# Patient Record
Sex: Male | Born: 1995 | Race: White | Hispanic: No | Marital: Single | Smoking: Never smoker
Health system: Southern US, Community
[De-identification: ages and names within clinical notes are randomized; demographics above are authoritative.]

## PROBLEM LIST (undated history)

## (undated) DIAGNOSIS — F988 Other specified behavioral and emotional disorders with onset usually occurring in childhood and adolescence: Secondary | ICD-10-CM

## (undated) DIAGNOSIS — T7840XA Allergy, unspecified, initial encounter: Secondary | ICD-10-CM

## (undated) HISTORY — DX: Allergy, unspecified, initial encounter: T78.40XA

## (undated) HISTORY — PX: NO PAST SURGERIES: SHX2092

---
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
## 2017-03-07 ENCOUNTER — Encounter: Payer: Self-pay | Admitting: Family Medicine

## 2017-03-07 ENCOUNTER — Ambulatory Visit (INDEPENDENT_AMBULATORY_CARE_PROVIDER_SITE_OTHER): Payer: 59 | Admitting: Family Medicine

## 2017-03-07 VITALS — BP 102/68 | HR 87 | Temp 98.4°F | Wt 220.6 lb

## 2017-03-07 DIAGNOSIS — Z23 Encounter for immunization: Secondary | ICD-10-CM | POA: Diagnosis not present

## 2017-03-07 DIAGNOSIS — F988 Other specified behavioral and emotional disorders with onset usually occurring in childhood and adolescence: Secondary | ICD-10-CM

## 2017-03-07 MED ORDER — ATOMOXETINE HCL 80 MG PO CAPS: ORAL_CAPSULE | ORAL | 6 refills | Status: DC

## 2017-03-07 NOTE — Addendum Note (Signed)
Addended by: Dortha KernHRISMON, Eyonna Sandstrom E on: 03/07/2017 02:19 PM   Modules accepted: Orders

## 2017-03-07 NOTE — Progress Notes (Signed)
   Patient: Jonathan Mcclure Male    DOB: 03/24/1996   21 y.o.   MRN: [id number] Visit Date: 03/07/2017  Today's Provider: Dortha Kernennis Chrismon, PA   Chief Complaint  Patient presents with  . ADD  . Follow-up   Subjective:    HPI ADD Follow Up:  Patient is here for 3 month follow up. Last OV was 12/06/2016. Patient advised to continue Strattera 80 mg. Patient reports good compliance with treatment plan. Symptoms are stable. He is requesting a refill on Strattera.   Past Medical History:  Diagnosis Date  . ADD (attention deficit disorder)   . ADD (attention deficit disorder)   . Allergy    Past Surgical History:  Procedure Laterality Date  . NO PAST SURGERIES     Family History  Problem Relation Age of Onset  . Autism Sister   . Diabetes Maternal Grandfather   . Heart disease Paternal Grandfather    No Known Allergies   Previous Medications   ATOMOXETINE (STRATTERA) 80 MG CAPSULE    Take one capsule by mouth daily.    Review of Systems  Constitutional: Negative.   Respiratory: Negative.   Cardiovascular: Negative.   Psychiatric/Behavioral: Negative.     Social History  Substance Use Topics  . Smoking status: Never Smoker  . Smokeless tobacco: Never Used  . Alcohol use No   Objective:   BP 102/68 (BP Location: Right Arm, Patient Position: Sitting, Cuff Size: Large)   Pulse 87   Temp 98.4 F (36.9 C) (Oral)   Wt 220 lb 9.6 oz (100.1 kg)   SpO2 98%   BMI 33.05 kg/m  Wt Readings from Last 3 Encounters:  03/07/17 220 lb 9.6 oz (100.1 kg) (97 %, Z= 1.91)*  12/06/16 230 lb 3.2 oz (104.4 kg) (98 %, Z= 2.10)*  10/25/16 230 lb 9.6 oz (104.6 kg) (98 %, Z= 2.11)*   * Growth percentiles are based on CDC 2-20 Years data.    Physical Exam  Constitutional: He is oriented to person, place, and time. He appears well-developed and well-nourished. No distress.  HENT:  Head: Normocephalic and atraumatic.  Right Ear: Hearing normal.  Left Ear: Hearing normal.  Nose: Nose  normal.  Eyes: Conjunctivae and lids are normal. Right eye exhibits no discharge. Left eye exhibits no discharge. No scleral icterus.  Cardiovascular: Normal rate.   Pulmonary/Chest: Effort normal and breath sounds normal. No respiratory distress.  Abdominal: Bowel sounds are normal.  Musculoskeletal: Normal range of motion.  Neurological: He is alert and oriented to person, place, and time.  Skin: Skin is intact. No lesion and no rash noted.  Psychiatric: He has a normal mood and affect. His speech is normal and behavior is normal. Thought content normal.      Assessment & Plan:     1. Attention deficit disorder (ADD) without hyperactivity Stable and well controlled. Good focus and sleeping well at night. No daytime drowsiness and good appetite. Refill Strattera and recheck in 4 months. - atomoxetine (STRATTERA) 80 MG capsule; Take one capsule by mouth daily.  Dispense: 30 capsule; Refill: 6  2. Need for influenza vaccination - Flu Vaccine QUAD 6+ mos PF IM (Fluarix Quad PF)

## 2017-03-08 ENCOUNTER — Ambulatory Visit: Payer: Self-pay

## 2017-07-07 ENCOUNTER — Encounter: Payer: Self-pay | Admitting: Family Medicine

## 2017-07-07 ENCOUNTER — Ambulatory Visit (INDEPENDENT_AMBULATORY_CARE_PROVIDER_SITE_OTHER): Payer: 59 | Admitting: Family Medicine

## 2017-07-07 VITALS — BP 128/68 | HR 98 | Temp 98.1°F | Wt 225.8 lb

## 2017-07-07 DIAGNOSIS — F988 Other specified behavioral and emotional disorders with onset usually occurring in childhood and adolescence: Secondary | ICD-10-CM

## 2017-07-07 NOTE — Progress Notes (Signed)
     Patient: Jonathan ReaperJoseph D Odonell Male    DOB: 09/28/1995   22 y.o.   MRN: [id number] Visit Date: 07/07/2017  Today's Provider: Dortha Kernennis Rhylee Nunn, PA   Chief Complaint  Patient presents with  . ADD follow up   Subjective:    HPI ADD Follow Up:  Patient is here for 4 month follow up. Last OV was 03/07/2017. Patient advised to continue Strattera 80 mg. Patient reports poor compliance with treatment plan. Patient states he discontinued medication because he is joining the [employer]. He is set to join [employer] on 07/11/16. Symptoms are stable with medication at this time.   Past Medical History:  Diagnosis Date  . ADD (attention deficit disorder)   . ADD (attention deficit disorder)   . Allergy    Past Surgical History:  Procedure Laterality Date  . NO PAST SURGERIES     Family History  Problem Relation Age of Onset  . Autism Sister   . Diabetes Maternal Grandfather   . Heart disease Paternal Grandfather    No Known Allergies  Current Outpatient Medications:  .  atomoxetine (STRATTERA) 80 MG capsule, Take one capsule by mouth daily., Disp: 30 capsule, Rfl: 6  Review of Systems  Constitutional: Negative.   Respiratory: Negative.   Cardiovascular: Negative.   Psychiatric/Behavioral: Negative.    Social History   Tobacco Use  . Smoking status: Never Smoker  . Smokeless tobacco: Never Used  Substance Use Topics  . Alcohol use: No   Objective:   BP 128/68 (BP Location: Right Arm, Patient Position: Sitting, Cuff Size: Normal)   Pulse 98   Temp 98.1 F (36.7 C) (Oral)   Wt 225 lb 12.8 oz (102.4 kg)   SpO2 97%   BMI 33.83 kg/m  Wt Readings from Last 3 Encounters:  07/07/17 225 lb 12.8 oz (102.4 kg) (98 %, Z= 1.99)*  03/07/17 220 lb 9.6 oz (100.1 kg) (97 %, Z= 1.91)*  12/06/16 230 lb 3.2 oz (104.4 kg) (98 %, Z= 2.10)*   * Growth percentiles are based on CDC (Boys, 2-20 Years) data.   Physical Exam  Constitutional: He is oriented to person, place, and time. He appears  well-developed and well-nourished. No distress.  HENT:  Head: Normocephalic and atraumatic.  Right Ear: Hearing and external ear normal.  Left Ear: Hearing and external ear normal.  Nose: Nose normal.  Mouth/Throat: Oropharynx is clear and moist.  Eyes: Conjunctivae and lids are normal. Right eye exhibits no discharge. Left eye exhibits no discharge. No scleral icterus.  Neck: Neck supple.  Cardiovascular: Normal rate and regular rhythm.  Pulmonary/Chest: Effort normal and breath sounds normal. No respiratory distress.  Abdominal: Soft. Bowel sounds are normal.  Musculoskeletal: Normal range of motion.  Lymphadenopathy:    He has no cervical adenopathy.  Neurological: He is alert and oriented to person, place, and time.  Skin: Skin is intact. No lesion and no rash noted.  Psychiatric: He has a normal mood and affect. His speech is normal and behavior is normal. Thought content normal.      Assessment & Plan:     1. Attention deficit disorder (ADD) without hyperactivity Has been off the Strattera for a couple weeks without signs of focur or concentration difficulties. Sleeping well without irritability or aggression. Discontinue all Strattera and follow up as needed.       Dortha Kernennis Da Michelle, PA  [facility]

## 2018-06-17 DIAGNOSIS — M5408 Panniculitis affecting regions of neck and back, sacral and sacrococcygeal region: Secondary | ICD-10-CM | POA: Diagnosis not present

## 2018-06-17 DIAGNOSIS — M9903 Segmental and somatic dysfunction of lumbar region: Secondary | ICD-10-CM | POA: Diagnosis not present

## 2018-06-17 DIAGNOSIS — M545 Low back pain: Secondary | ICD-10-CM | POA: Diagnosis not present

## 2018-06-17 DIAGNOSIS — M546 Pain in thoracic spine: Secondary | ICD-10-CM | POA: Diagnosis not present

## 2018-06-17 DIAGNOSIS — M9902 Segmental and somatic dysfunction of thoracic region: Secondary | ICD-10-CM | POA: Diagnosis not present

## 2018-06-18 DIAGNOSIS — M9903 Segmental and somatic dysfunction of lumbar region: Secondary | ICD-10-CM | POA: Diagnosis not present

## 2018-06-18 DIAGNOSIS — M546 Pain in thoracic spine: Secondary | ICD-10-CM | POA: Diagnosis not present

## 2018-06-18 DIAGNOSIS — M545 Low back pain: Secondary | ICD-10-CM | POA: Diagnosis not present

## 2018-06-18 DIAGNOSIS — M9902 Segmental and somatic dysfunction of thoracic region: Secondary | ICD-10-CM | POA: Diagnosis not present

## 2018-06-18 DIAGNOSIS — M5408 Panniculitis affecting regions of neck and back, sacral and sacrococcygeal region: Secondary | ICD-10-CM | POA: Diagnosis not present

## 2018-06-22 DIAGNOSIS — M25571 Pain in right ankle and joints of right foot: Secondary | ICD-10-CM | POA: Diagnosis not present

## 2018-07-07 DIAGNOSIS — M545 Low back pain: Secondary | ICD-10-CM | POA: Diagnosis not present

## 2018-07-07 DIAGNOSIS — M9903 Segmental and somatic dysfunction of lumbar region: Secondary | ICD-10-CM | POA: Diagnosis not present

## 2018-07-07 DIAGNOSIS — M5408 Panniculitis affecting regions of neck and back, sacral and sacrococcygeal region: Secondary | ICD-10-CM | POA: Diagnosis not present

## 2018-07-07 DIAGNOSIS — M9902 Segmental and somatic dysfunction of thoracic region: Secondary | ICD-10-CM | POA: Diagnosis not present

## 2018-07-07 DIAGNOSIS — M546 Pain in thoracic spine: Secondary | ICD-10-CM | POA: Diagnosis not present

## 2018-07-09 NOTE — Progress Notes (Signed)
Patient: Jonathan ReaperJoseph D Deblanc Male    DOB: 01/22/1996   23 y.o.   MRN: [id number] Visit Date: 07/10/2018  Today's Provider: Dortha Kernennis Lucill Mauck, PA   Chief Complaint  Patient presents with  . Follow-up    imaging   Subjective:     HPI  Patient comes in today for follow-up of a right anterolateral talus osteochondral fracture with questionable small loose body seen on MRI on 02-19-18 secondary to stepping into a deep pothole while marching with a heavy back pack weight in June 2019 during [employer] training. Was advised to go to an Urgent Care Clinic in July 2019 because pain persisted and that was when bony changes were first recognized. He was treated with a cam cast, crutches and went through physical therapy with the [employer]. Continued to have pain and difficulties with the right ankle. He was advised to get a bone density test in September 2019 and was told it raised a question of bone thinning. He has had evaluation by a local orthopedist (Dr. Dion SaucierLandau) on 06-22-18 who felt he was progressing slowly with his healing. He is walking better but has not been running. Has continued some exercise without pain or swelling. Worried about possibility of osteoporosis causing him to be discharged from [employer] and wants to get a second opinion BMD test.  Past Medical History:  Diagnosis Date  . ADD (attention deficit disorder)   . ADD (attention deficit disorder)   . Allergy    Past Surgical History:  Procedure Laterality Date  . NO PAST SURGERIES     Family History  Problem Relation Age of Onset  . Autism Sister   . Diabetes Maternal Grandfather   . Heart disease Paternal Grandfather    No Known Allergies  Current Outpatient Medications:  .  Cholecalciferol (VITAMIN D) 50 MCG (2000 UT) CAPS, Take by mouth., Disp: , Rfl:  .  atomoxetine (STRATTERA) 80 MG capsule, Take one capsule by mouth daily. (Patient not taking: Reported on 07/10/2018), Disp: 30 capsule, Rfl: 6  Review of  Systems  Constitutional: Negative.   HENT: Negative.   Eyes: Negative.   Respiratory: Negative.   Cardiovascular: Negative.   Gastrointestinal: Negative.   Genitourinary: Negative.   Musculoskeletal: Negative.     Social History   Tobacco Use  . Smoking status: Never Smoker  . Smokeless tobacco: Never Used  Substance Use Topics  . Alcohol use: No     Objective:   BP 112/62 (BP Location: Right Arm, Patient Position: Sitting, Cuff Size: Large)   Pulse 73   Temp 98 F (36.7 C) (Oral)   Resp 16   Wt 209 lb 12.8 oz (95.2 kg)   SpO2 98%   BMI 31.44 kg/m  Vitals:   07/10/18 0930  BP: 112/62  Pulse: 73  Resp: 16  Temp: 98 F (36.7 C)  TempSrc: Oral  SpO2: 98%  Weight: 209 lb 12.8 oz (95.2 kg)   Physical Exam Constitutional:      General: He is not in acute distress.    Appearance: He is well-developed.  HENT:     Head: Normocephalic and atraumatic.     Right Ear: Hearing and tympanic membrane normal.     Left Ear: Hearing and tympanic membrane normal.     Nose: Nose normal.  Eyes:     General: Lids are normal. No scleral icterus.       Right eye: No discharge.  Left eye: No discharge.     Conjunctiva/sclera: Conjunctivae normal.  Neck:     Musculoskeletal: Neck supple.  Cardiovascular:     Rate and Rhythm: Normal rate and regular rhythm.  Pulmonary:     Effort: Pulmonary effort is normal. No respiratory distress.  Abdominal:     General: Bowel sounds are normal.     Palpations: Abdomen is soft.  Musculoskeletal: Normal range of motion.  Skin:    Findings: No lesion or rash.  Neurological:     Mental Status: He is alert and oriented to person, place, and time.     Motor: No weakness.     Gait: Gait normal.     Deep Tendon Reflexes: Reflexes normal.  Psychiatric:        Speech: Speech normal.        Behavior: Behavior normal.        Thought Content: Thought content normal.       Assessment & Plan    1. Closed displaced fracture of right  talus with delayed healing, unspecified portion of talus, subsequent encounter Onset of initial injury was in June 2019 when he fell as he stepped into a pothole that was 6-8" deep while marching with a heavy back pack. MRI on 02-19-18 demonstrated an osteochondral fracture of the right anterolateral talus with a questionable small loose body. No limping or ataxic gait today. No tenderness to palpation or testing ROM. Worried about osteoporosis with history of multiple fracture events as a child and this episode. Will get BMD test for confirmation. Continue diet rich in calcium and Vitamin-D 2000 IU daily. Proceed with orthopedic follow up with Dr. Dion SaucierLandau (at [facility]) as planned - DG Bone Density  2. Screening for osteoporosis Will get BMD with history of multiple fractures (once in both wrists on separate occasions and now the right talus).  - DG Bone Density      Dortha Kernennis Audrionna Lampton, PA  [facility]

## 2018-07-10 ENCOUNTER — Encounter: Payer: Self-pay | Admitting: Family Medicine

## 2018-07-10 ENCOUNTER — Ambulatory Visit (INDEPENDENT_AMBULATORY_CARE_PROVIDER_SITE_OTHER): Payer: 59 | Admitting: Family Medicine

## 2018-07-10 VITALS — BP 112/62 | HR 73 | Temp 98.0°F | Resp 16 | Wt 209.8 lb

## 2018-07-10 DIAGNOSIS — Z13820 Encounter for screening for osteoporosis: Secondary | ICD-10-CM | POA: Diagnosis not present

## 2018-07-10 DIAGNOSIS — S92101G Unspecified fracture of right talus, subsequent encounter for fracture with delayed healing: Secondary | ICD-10-CM

## 2018-07-20 DIAGNOSIS — M25571 Pain in right ankle and joints of right foot: Secondary | ICD-10-CM | POA: Diagnosis not present

## 2018-07-21 DIAGNOSIS — M5408 Panniculitis affecting regions of neck and back, sacral and sacrococcygeal region: Secondary | ICD-10-CM | POA: Diagnosis not present

## 2018-07-21 DIAGNOSIS — M9902 Segmental and somatic dysfunction of thoracic region: Secondary | ICD-10-CM | POA: Diagnosis not present

## 2018-07-21 DIAGNOSIS — M545 Low back pain: Secondary | ICD-10-CM | POA: Diagnosis not present

## 2018-07-21 DIAGNOSIS — M9903 Segmental and somatic dysfunction of lumbar region: Secondary | ICD-10-CM | POA: Diagnosis not present

## 2018-07-21 DIAGNOSIS — M546 Pain in thoracic spine: Secondary | ICD-10-CM | POA: Diagnosis not present

## 2018-08-27 ENCOUNTER — Ambulatory Visit
Admission: RE | Admit: 2018-08-27 | Discharge: 2018-08-27 | Disposition: A | Discharge status: Home / Self Care | Payer: 59 | Source: Ambulatory Visit | Attending: Family Medicine | Admitting: Family Medicine

## 2018-08-27 DIAGNOSIS — S92101G Unspecified fracture of right talus, subsequent encounter for fracture with delayed healing: Secondary | ICD-10-CM | POA: Diagnosis not present

## 2018-08-27 DIAGNOSIS — Z13820 Encounter for screening for osteoporosis: Secondary | ICD-10-CM | POA: Diagnosis not present

## 2018-11-30 ENCOUNTER — Telehealth: Payer: Self-pay | Admitting: *Deleted

## 2018-11-30 NOTE — Telephone Encounter (Signed)
Please advise 

## 2018-11-30 NOTE — Telephone Encounter (Signed)
Patient states he was exposed to covid-19 at work by several positive people. Patient has been advised by his employer to contact his provider for testing. Please advise?

## 2018-12-01 ENCOUNTER — Telehealth: Payer: Self-pay

## 2018-12-01 DIAGNOSIS — Z20822 Contact with and (suspected) exposure to covid-19: Secondary | ICD-10-CM

## 2018-12-01 NOTE — Telephone Encounter (Signed)
Patient called and left message to return call to schedule testing.

## 2018-12-01 NOTE — Telephone Encounter (Signed)
Forward message.

## 2018-12-01 NOTE — Telephone Encounter (Signed)
Jonathan Mcclure request COVID 19 test. Left message for pt. to call back and schedule testing.

## 2018-12-01 NOTE — Telephone Encounter (Signed)
See telephone encounter for 12/01/18. Message left for pt to return call for scheduling.

## 2018-12-03 NOTE — Addendum Note (Signed)
Addended by: Phillips Odor on: 12/03/2018 08:45 AM   Modules accepted: Orders

## 2018-12-03 NOTE — Telephone Encounter (Signed)
Pt. Returned call.  Scheduled for COVID 19 test 12/04/18 @ 8:30 AM, at the [facility].  Instructed to wear mask, drive up to site, and remain in car.  Verb. Understanding.

## 2018-12-04 ENCOUNTER — Other Ambulatory Visit: Payer: 59

## 2018-12-04 DIAGNOSIS — Z20822 Contact with and (suspected) exposure to covid-19: Secondary | ICD-10-CM

## 2018-12-04 DIAGNOSIS — R6889 Other general symptoms and signs: Secondary | ICD-10-CM | POA: Diagnosis not present

## 2018-12-07 LAB — NOVEL CORONAVIRUS, NAA: SARS-CoV-2, NAA: NOT DETECTED

## 2019-03-17 ENCOUNTER — Ambulatory Visit (INDEPENDENT_AMBULATORY_CARE_PROVIDER_SITE_OTHER): Payer: 59

## 2019-03-17 ENCOUNTER — Other Ambulatory Visit: Payer: Self-pay

## 2019-03-17 DIAGNOSIS — Z23 Encounter for immunization: Secondary | ICD-10-CM | POA: Diagnosis not present

## 2019-06-03 ENCOUNTER — Telehealth: Payer: Self-pay

## 2019-06-03 NOTE — Telephone Encounter (Signed)
Spoke with [facility] and they do not see patient test from 05/21/2019. Patient was advised to go and retest. Patient verbalized understanding

## 2019-06-09 ENCOUNTER — Other Ambulatory Visit: Payer: Self-pay | Admitting: *Deleted

## 2019-06-09 DIAGNOSIS — Z20822 Contact with and (suspected) exposure to covid-19: Secondary | ICD-10-CM

## 2019-06-10 LAB — NOVEL CORONAVIRUS, NAA: SARS-CoV-2, NAA: NOT DETECTED

## 2019-08-27 ENCOUNTER — Other Ambulatory Visit: Payer: Self-pay

## 2019-08-27 ENCOUNTER — Ambulatory Visit (INDEPENDENT_AMBULATORY_CARE_PROVIDER_SITE_OTHER): Payer: 59 | Admitting: Family Medicine

## 2019-08-27 ENCOUNTER — Encounter: Payer: Self-pay | Admitting: Family Medicine

## 2019-08-27 VITALS — BP 113/71 | HR 84 | Temp 97.1°F | Resp 16 | Ht 69.0 in | Wt 230.0 lb

## 2019-08-27 DIAGNOSIS — F988 Other specified behavioral and emotional disorders with onset usually occurring in childhood and adolescence: Secondary | ICD-10-CM | POA: Diagnosis not present

## 2019-08-27 DIAGNOSIS — L659 Nonscarring hair loss, unspecified: Secondary | ICD-10-CM | POA: Diagnosis not present

## 2019-08-27 DIAGNOSIS — M25561 Pain in right knee: Secondary | ICD-10-CM | POA: Diagnosis not present

## 2019-08-27 MED ORDER — ATOMOXETINE HCL 80 MG PO CAPS: ORAL_CAPSULE | ORAL | 6 refills | Status: DC

## 2019-08-27 NOTE — Progress Notes (Signed)
Patient: Jonathan Mcclure Male    DOB: 11-26-1995   24 y.o.   MRN: [id number] Visit Date: 08/27/2019  Today's Provider: Dortha Kern, PA   Chief Complaint  Patient presents with  . Follow-up  . ADHD  . Knee Pain   Subjective:     HPI   Attention deficit disorder (ADD) without hyperactivity From 07/07/2017-Has been off the Strattera for a couple weeks without signs of focur or concentration difficulties. Sleeping well without irritability or aggression. Discontinue all Strattera and follow up as needed.  Patient is not currently taking medication for ADHD. However he is still having attention issues.  Patient also wants to discuss right knee and ankle pain, from a fracture he sustained in June 2019. Patient states he is still hain pain in the right knee and ankle. Patient also states he has slight intermittent swelling in right ankle. Patient states he has been treating pain with tylenol and ibuprofen with moderate relief.  Past Medical History:  Diagnosis Date  . ADD (attention deficit disorder)   . ADD (attention deficit disorder)   . Allergy    Past Surgical History:  Procedure Laterality Date  . NO PAST SURGERIES     Family History  Problem Relation Age of Onset  . Autism Sister   . Diabetes Maternal Grandfather   . Heart disease Paternal Grandfather    No Known Allergies  Current Outpatient Medications:  .  BIOTIN PO, Take by mouth., Disp: , Rfl:  .  Cholecalciferol (VITAMIN D) 50 MCG (2000 UT) CAPS, Take by mouth., Disp: , Rfl:  .  atomoxetine (STRATTERA) 80 MG capsule, Take one capsule by mouth daily. (Patient not taking: Reported on 07/10/2018), Disp: 30 capsule, Rfl: 6  Review of Systems  Constitutional: Negative for appetite change, chills and fever.  Respiratory: Negative for chest tightness, shortness of breath and wheezing.   Cardiovascular: Negative for chest pain and palpitations.  Gastrointestinal: Negative for abdominal pain, nausea and  vomiting.    Social History   Tobacco Use  . Smoking status: Never Smoker  . Smokeless tobacco: Never Used  Substance Use Topics  . Alcohol use: No     Objective:   BP 113/71 (BP Location: Right Arm, Patient Position: Sitting, Cuff Size: Large)   Pulse 84   Temp (!) 97.1 F (36.2 C) (Other (Comment))   Resp 16   Ht 5\' 9"  (1.753 m)   Wt 230 lb (104.3 kg)   SpO2 97%   BMI 33.97 kg/m  Vitals:   08/27/19 1334  BP: 113/71  Pulse: 84  Resp: 16  Temp: (!) 97.1 F (36.2 C)  TempSrc: Other (Comment)  SpO2: 97%  Weight: 230 lb (104.3 kg)  Height: 5\' 9"  (1.753 m)  Body mass index is 33.97 kg/m.  Physical Exam Constitutional:      General: He is not in acute distress.    Appearance: He is well-developed.  HENT:     Head: Normocephalic and atraumatic.     Right Ear: Hearing and tympanic membrane normal.     Left Ear: Hearing and tympanic membrane normal.     Nose: Nose normal.  Eyes:     General: Lids are normal. No scleral icterus.       Right eye: No discharge.        Left eye: No discharge.     Conjunctiva/sclera: Conjunctivae normal.  Cardiovascular:     Rate and Rhythm: Normal rate and regular rhythm.  Heart sounds: Normal heart sounds.  Pulmonary:     Effort: Pulmonary effort is normal. No respiratory distress.  Musculoskeletal:        General: Normal range of motion.     Cervical back: Neck supple.     Comments: Fair ROM in all extremities. No swelling, locking or clicking in knees. History of fracture in the right ankle/talus in 2019.  Skin:    Findings: No lesion or rash.     Comments: No alopecia. Receding hairline.  Neurological:     Mental Status: He is alert and oriented to person, place, and time.  Psychiatric:        Speech: Speech normal.        Behavior: Behavior normal.        Thought Content: Thought content normal.       Assessment & Plan    1. Attention deficit disorder (ADD) without hyperactivity Having some difficulty with  concentration and focus as in the past. Had good response from Strattera in the past without sleeping difficulties, headaches, palpitation or BP elevation. Will check routine follow up labs and refill Strattera. Check routine labs and recheck appointment pending lab results. - atomoxetine (STRATTERA) 80 MG capsule; Take one capsule by mouth daily.  Dispense: 30 capsule; Refill: 6 - CBC with Differential/Platelet - Comprehensive metabolic panel - TSH  2. Thinning hair No areas of balding. 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 Admits family history positive for baldness.  3. Right knee pain, unspecified chronicity Some aches in right knee that is relieved by use of Aleve and Tylenol. Suspect secondary to past trauma to the right ankle/talus. No swelling, locking, giving away or difficulty walking. May continue this regimen and recheck if persistent (may need x-ray evaluation).     Vernie Murders, PA  [facility]

## 2019-08-31 DIAGNOSIS — F988 Other specified behavioral and emotional disorders with onset usually occurring in childhood and adolescence: Secondary | ICD-10-CM | POA: Diagnosis not present

## 2019-09-01 LAB — COMPREHENSIVE METABOLIC PANEL
ALT: 47 IU/L — ABNORMAL HIGH (ref 0–44)
AST: 29 IU/L (ref 0–40)
Albumin/Globulin Ratio: 1.6 (ref 1.2–2.2)
Albumin: 4.4 g/dL (ref 4.1–5.2)
Alkaline Phosphatase: 99 IU/L (ref 39–117)
BUN/Creatinine Ratio: 6 — ABNORMAL LOW (ref 9–20)
BUN: 6 mg/dL (ref 6–20)
Bilirubin Total: 0.5 mg/dL (ref 0.0–1.2)
CO2: 24 mmol/L (ref 20–29)
Calcium: 9.5 mg/dL (ref 8.7–10.2)
Chloride: 105 mmol/L (ref 96–106)
Creatinine, Ser: 0.94 mg/dL (ref 0.76–1.27)
GFR calc Af Amer: 133 mL/min/{1.73_m2} (ref >59)
GFR calc non Af Amer: 115 mL/min/{1.73_m2} (ref >59)
Globulin, Total: 2.7 g/dL (ref 1.5–4.5)
Glucose: 81 mg/dL (ref 65–99)
Potassium: 4.3 mmol/L (ref 3.5–5.2)
Sodium: 140 mmol/L (ref 134–144)
Total Protein: 7.1 g/dL (ref 6.0–8.5)

## 2019-09-01 LAB — CBC WITH DIFFERENTIAL/PLATELET
Basophils Absolute: 0 10*3/uL (ref 0.0–0.2)
Basos: 1 %
EOS (ABSOLUTE): 0.1 10*3/uL (ref 0.0–0.4)
Eos: 2 %
Hematocrit: 45.2 % (ref 37.5–51.0)
Hemoglobin: 15.9 g/dL (ref 13.0–17.7)
Immature Grans (Abs): 0 10*3/uL (ref 0.0–0.1)
Immature Granulocytes: 0 %
Lymphocytes Absolute: 2.1 10*3/uL (ref 0.7–3.1)
Lymphs: 33 %
MCH: 29.6 pg (ref 26.6–33.0)
MCHC: 35.2 g/dL (ref 31.5–35.7)
MCV: 84 fL (ref 79–97)
Monocytes Absolute: 0.4 10*3/uL (ref 0.1–0.9)
Monocytes: 7 %
Neutrophils Absolute: 3.7 10*3/uL (ref 1.4–7.0)
Neutrophils: 57 %
Platelets: 291 10*3/uL (ref 150–450)
RBC: 5.38 x10E6/uL (ref 4.14–5.80)
RDW: 13.4 % (ref 11.6–15.4)
WBC: 6.3 10*3/uL (ref 3.4–10.8)

## 2019-09-01 LAB — TSH: TSH: 1.55 u[IU]/mL (ref 0.450–4.500)

## 2019-10-02 ENCOUNTER — Ambulatory Visit: Payer: 59 | Attending: Internal Medicine

## 2019-10-02 DIAGNOSIS — Z23 Encounter for immunization: Secondary | ICD-10-CM

## 2019-10-02 NOTE — Progress Notes (Signed)
   Covid-19 Vaccination Clinic  Name:  Jonathan Mcclure    MRN: [id number] DOB: Nov 16, 1995  10/02/2019  [name] was observed post Covid-19 immunization for 15 minutes without incident. He was provided with Vaccine Information Sheet and instruction to access the V-Safe system.   [name] was instructed to call 911 with any severe reactions post vaccine: Marland Kitchen Difficulty breathing  . Swelling of face and throat  . A fast heartbeat  . A bad rash all over body  . Dizziness and weakness   Immunizations Administered    Name Date Dose VIS Date Route   Pfizer COVID-19 Vaccine 10/02/2019 11:10 AM 0.3 mL 06/11/2019 Intramuscular   Manufacturer: [employer], [employer]   Lot: [id number]   NDC: [address]-4

## 2019-10-06 NOTE — Progress Notes (Signed)
Established patient visit      Patient: Jonathan Mcclure   DOB: [birth year]   24 y.o. Male  MRN: [id number] Visit Date: 10/07/2019  Today's healthcare provider: Dortha Kern, PA  Subjective:    Chief Complaint  Patient presents with  . Follow-up   HPI  Follow up for ADHD:  The patient was last seen for this 08/27/2019. Changes made at last visit include refilling Strattera and advising patient to follow up in 1 month.  He reports good compliance with treatment. He feels that condition is Improved. He is not having side effects.   ------------------------------------------------------------------------------------   Past Medical History:  Diagnosis Date  . ADD (attention deficit disorder)   . ADD (attention deficit disorder)   . Allergy    Past Surgical History:  Procedure Laterality Date  . NO PAST SURGERIES     No Known Allergies    Medications: Outpatient Medications Prior to Visit  Medication Sig  . atomoxetine (STRATTERA) 80 MG capsule Take one capsule by mouth daily.  Marland Kitchen BIOTIN PO Take by mouth.  . Cholecalciferol (VITAMIN D) 50 MCG (2000 UT) CAPS Take by mouth.   No facility-administered medications prior to visit.    Review of Systems  Constitutional: Negative for appetite change, chills and fever.  Respiratory: Negative for chest tightness, shortness of breath and wheezing.   Cardiovascular: Negative for chest pain and palpitations.  Gastrointestinal: Negative for abdominal pain, nausea and vomiting.        Objective:    BP 132/69 (BP Location: Right Arm, Patient Position: Sitting, Cuff Size: Large)   Pulse 88   Temp (!) 96.8 F (36 C) (Temporal)   Resp 16   Wt 231 lb (104.8 kg)   BMI 34.11 kg/m    Physical Exam Constitutional:      General: He is not in acute distress.    Appearance: He is well-developed.  HENT:     Head: Normocephalic and atraumatic.     Right Ear: Hearing and tympanic membrane normal.     Left Ear: Hearing and  tympanic membrane normal.     Nose: Nose normal.  Eyes:     General: Lids are normal. No scleral icterus.       Right eye: No discharge.        Left eye: No discharge.     Conjunctiva/sclera: Conjunctivae normal.  Cardiovascular:     Rate and Rhythm: Normal rate.     Heart sounds: Normal heart sounds.  Pulmonary:     Effort: Pulmonary effort is normal. No respiratory distress.  Musculoskeletal:        General: Normal range of motion.     Cervical back: Neck supple.  Skin:    Findings: No lesion or rash.  Neurological:     Mental Status: He is alert and oriented to person, place, and time.  Psychiatric:        Speech: Speech normal.        Behavior: Behavior normal.        Thought Content: Thought content normal.        Assessment & Plan:    1. Attention deficit disorder (ADD) without hyperactivity Feeling well and improved with the Strattera 80 mg qd. Able to accomplish tasks and no hyperactivity. Less snacking and no further depressive symptoms. Will continue this dosage and recheck in 4-6 months.      Jonathan Kern, PA  [facility] [phone] (phone) [phone] (fax)  [facility]

## 2019-10-07 ENCOUNTER — Other Ambulatory Visit: Payer: Self-pay

## 2019-10-07 ENCOUNTER — Ambulatory Visit (INDEPENDENT_AMBULATORY_CARE_PROVIDER_SITE_OTHER): Payer: 59 | Admitting: Family Medicine

## 2019-10-07 ENCOUNTER — Encounter: Payer: Self-pay | Admitting: Family Medicine

## 2019-10-07 VITALS — BP 132/69 | HR 88 | Temp 96.8°F | Resp 16 | Wt 231.0 lb

## 2019-10-07 DIAGNOSIS — F988 Other specified behavioral and emotional disorders with onset usually occurring in childhood and adolescence: Secondary | ICD-10-CM | POA: Diagnosis not present

## 2019-10-27 ENCOUNTER — Ambulatory Visit: Payer: 59 | Attending: Internal Medicine

## 2019-10-27 DIAGNOSIS — Z23 Encounter for immunization: Secondary | ICD-10-CM

## 2019-10-27 NOTE — Progress Notes (Signed)
   Covid-19 Vaccination Clinic  Name:  HELIO LACK    MRN: [id number] DOB: April 09, 1996  10/27/2019  Mr. Burak was observed post Covid-19 immunization for 15 minutes without incident. He was provided with Vaccine Information Sheet and instruction to access the V-Safe system.   Mr. Eisenhardt was instructed to call 911 with any severe reactions post vaccine: Marland Kitchen Difficulty breathing  . Swelling of face and throat  . A fast heartbeat  . A bad rash all over body  . Dizziness and weakness   Immunizations Administered    Name Date Dose VIS Date Route   Pfizer COVID-19 Vaccine 10/27/2019  9:02 AM 0.3 mL 08/25/2018 Intramuscular   Manufacturer: [employer], [employer]   Lot: [id number]   NDC: [address]-7

## 2020-02-07 ENCOUNTER — Ambulatory Visit: Payer: 59 | Admitting: Family Medicine

## 2020-02-08 NOTE — Progress Notes (Signed)
Established patient visit   Patient: Jonathan Mcclure   DOB: December 12, 1995   24 y.o. Male  MRN: [id number] Visit Date: 02/10/2020  Today's healthcare provider: Margaretann Loveless, PA-C   Chief Complaint  Patient presents with  . ADD  .acribeatt  Subjective    HPI  Follow up for ADD  The patient was last seen for this 4 months ago. Changes made at last visit include Continue Strattera 80 mg qd  He reports good compliance with treatment. He feels that condition is stable. He is not having side effects.   -----------------------------------------------------------------------------------------   There are no problems to display for this patient.  Past Medical History:  Diagnosis Date  . ADD (attention deficit disorder)   . ADD (attention deficit disorder)   . Allergy        Medications: Outpatient Medications Prior to Visit  Medication Sig  . atomoxetine (STRATTERA) 80 MG capsule Take one capsule by mouth daily.  Marland Kitchen BIOTIN PO Take by mouth.  . Cholecalciferol (VITAMIN D) 50 MCG (2000 UT) CAPS Take by mouth.   No facility-administered medications prior to visit.    Review of Systems  Constitutional: Negative.   Respiratory: Negative.   Cardiovascular: Negative.   Musculoskeletal: Negative.   Psychiatric/Behavioral: Positive for decreased concentration.    Last CBC Lab Results  Component Value Date   WBC 6.3 08/31/2019   HGB 15.9 08/31/2019   HCT 45.2 08/31/2019   MCV 84 08/31/2019   MCH 29.6 08/31/2019   RDW 13.4 08/31/2019   PLT 291 08/31/2019   Last metabolic panel Lab Results  Component Value Date   GLUCOSE 81 08/31/2019   NA 140 08/31/2019   K 4.3 08/31/2019   CL 105 08/31/2019   CO2 24 08/31/2019   BUN 6 08/31/2019   CREATININE 0.94 08/31/2019   GFRNONAA 115 08/31/2019   GFRAA 133 08/31/2019   CALCIUM 9.5 08/31/2019   PROT 7.1 08/31/2019   ALBUMIN 4.4 08/31/2019   LABGLOB 2.7 08/31/2019   AGRATIO 1.6 08/31/2019   BILITOT 0.5  08/31/2019   ALKPHOS 99 08/31/2019   AST 29 08/31/2019   ALT 47 (H) 08/31/2019      Objective    BP 120/73 (BP Location: Right Arm, Patient Position: Sitting, Cuff Size: Large)   Pulse 78   Temp 98.1 F (36.7 C) (Oral)   Ht 5\' 9"  (1.753 m)   Wt 228 lb 3.2 oz (103.5 kg)   BMI 33.70 kg/m  BP Readings from Last 3 Encounters:  02/10/20 120/73  10/07/19 132/69  08/27/19 113/71   Wt Readings from Last 3 Encounters:  02/10/20 228 lb 3.2 oz (103.5 kg)  10/07/19 231 lb (104.8 kg)  08/27/19 230 lb (104.3 kg)      Physical Exam   General: Appearance:    Obese male in no acute distress  Eyes:    PERRL, conjunctiva/corneas clear, EOM's intact       Lungs:     Clear to auscultation bilaterally, respirations unlabored  Heart:    Normal heart rate. Normal rhythm. No murmurs, rubs, or gallops.   MS:   All extremities are intact.   Neurologic:   Awake, alert, oriented x 3. No apparent focal neurological           defect.         No results found for any visits on 02/10/20.  Assessment & Plan     1. Attention deficit disorder (ADD) without hyperactivity Stable. Diagnosis pulled  for medication refill. Continue current medical treatment plan. - atomoxetine (STRATTERA) 80 MG capsule; Take one capsule by mouth daily.  Dispense: 30 capsule; Refill: 6   No follow-ups on file.      Delmer Islam, PA-C, have reviewed all documentation for this visit. The documentation on 02/10/20 for the exam, diagnosis, procedures, and orders are all accurate and complete.   Reine Just  [facility] [phone] (phone) [phone] (fax)  [facility]

## 2020-02-10 ENCOUNTER — Other Ambulatory Visit: Payer: Self-pay

## 2020-02-10 ENCOUNTER — Encounter: Payer: Self-pay | Admitting: Physician Assistant

## 2020-02-10 ENCOUNTER — Ambulatory Visit (INDEPENDENT_AMBULATORY_CARE_PROVIDER_SITE_OTHER): Payer: 59 | Admitting: Physician Assistant

## 2020-02-10 DIAGNOSIS — F988 Other specified behavioral and emotional disorders with onset usually occurring in childhood and adolescence: Secondary | ICD-10-CM | POA: Diagnosis not present

## 2020-02-10 MED ORDER — ATOMOXETINE HCL 80 MG PO CAPS: ORAL_CAPSULE | ORAL | 6 refills | Status: AC

## 2020-02-10 NOTE — Patient Instructions (Signed)

## 2020-05-10 ENCOUNTER — Ambulatory Visit (INDEPENDENT_AMBULATORY_CARE_PROVIDER_SITE_OTHER): Payer: 59 | Admitting: Physician Assistant

## 2020-05-10 ENCOUNTER — Other Ambulatory Visit: Payer: Self-pay

## 2020-05-10 DIAGNOSIS — Z23 Encounter for immunization: Secondary | ICD-10-CM | POA: Diagnosis not present

## 2020-05-10 NOTE — Progress Notes (Signed)
Flu vaccine given today without complication. Patient sat upright for 15 minutes to check for adverse reaction before being released. °

## 2021-02-22 ENCOUNTER — Other Ambulatory Visit: Payer: Self-pay

## 2021-02-22 ENCOUNTER — Encounter: Payer: Self-pay | Admitting: Family Medicine

## 2021-02-22 ENCOUNTER — Ambulatory Visit (INDEPENDENT_AMBULATORY_CARE_PROVIDER_SITE_OTHER): Payer: 59 | Admitting: Family Medicine

## 2021-02-22 VITALS — BP 120/68 | HR 73 | Temp 98.4°F | Resp 16 | Ht 67.0 in | Wt 239.0 lb

## 2021-02-22 DIAGNOSIS — J069 Acute upper respiratory infection, unspecified: Secondary | ICD-10-CM

## 2021-02-22 DIAGNOSIS — L237 Allergic contact dermatitis due to plants, except food: Secondary | ICD-10-CM | POA: Diagnosis not present

## 2021-02-22 MED ORDER — AMOXICILLIN 875 MG PO TABS
875.0000 mg | ORAL_TABLET | Freq: Two times a day (BID) | ORAL | 0 refills | Status: DC
  Filled 2021-02-22: qty 20, 10d supply, fill #0

## 2021-02-22 MED ORDER — ALBUTEROL SULFATE HFA 108 (90 BASE) MCG/ACT IN AERS: 2.0000 | INHALATION_SPRAY | Freq: Four times a day (QID) | RESPIRATORY_TRACT | 2 refills | Status: AC | PRN

## 2021-02-22 MED ORDER — AMOXICILLIN 875 MG PO TABS: 875.0000 mg | ORAL_TABLET | Freq: Two times a day (BID) | ORAL | 0 refills | Status: AC

## 2021-02-22 MED ORDER — ALBUTEROL SULFATE HFA 108 (90 BASE) MCG/ACT IN AERS
2.0000 | INHALATION_SPRAY | Freq: Four times a day (QID) | RESPIRATORY_TRACT | 2 refills | Status: DC | PRN
  Filled 2021-02-22: qty 8.5, 25d supply, fill #0
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
## 2021-02-22 NOTE — Addendum Note (Signed)
Addended by: Anson [place] on: 02/22/2021 03:52 PM   Modules accepted: Orders

## 2022-01-24 ENCOUNTER — Telehealth: Payer: Self-pay

## 2022-01-24 NOTE — Telephone Encounter (Signed)
Copied from CRM [id number]. 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
